# Patient Record
Sex: Female | Born: 1970 | Race: Black or African American | Hispanic: No | Marital: Single | Smoking: Never smoker
Health system: Southern US, Community
[De-identification: ages and names within clinical notes are randomized; demographics above are authoritative.]

---

## 1998-05-21 ENCOUNTER — Other Ambulatory Visit: Admission: RE | Admit: 1998-05-21 | Discharge: 1998-05-21 | Payer: Self-pay | Admitting: Obstetrics

## 1998-09-13 ENCOUNTER — Other Ambulatory Visit: Admission: RE | Admit: 1998-09-13 | Discharge: 1998-09-13 | Payer: Self-pay | Admitting: Obstetrics

## 1998-12-04 ENCOUNTER — Inpatient Hospital Stay (HOSPITAL_COMMUNITY): Admission: AD | Admit: 1998-12-04 | Discharge: 1998-12-06 | Payer: Self-pay | Admitting: Internal Medicine

## 1998-12-08 ENCOUNTER — Inpatient Hospital Stay (HOSPITAL_COMMUNITY): Admission: AD | Admit: 1998-12-08 | Discharge: 1998-12-08 | Payer: Self-pay | Admitting: Obstetrics

## 1999-01-14 ENCOUNTER — Other Ambulatory Visit: Admission: RE | Admit: 1999-01-14 | Discharge: 1999-01-14 | Payer: Self-pay | Admitting: Obstetrics

## 2001-01-06 ENCOUNTER — Other Ambulatory Visit: Admission: RE | Admit: 2001-01-06 | Discharge: 2001-01-06 | Payer: Self-pay | Admitting: Obstetrics and Gynecology

## 2001-04-09 ENCOUNTER — Other Ambulatory Visit: Admission: RE | Admit: 2001-04-09 | Discharge: 2001-04-09 | Payer: Self-pay | Admitting: Obstetrics and Gynecology

## 2001-06-25 ENCOUNTER — Other Ambulatory Visit: Admission: RE | Admit: 2001-06-25 | Discharge: 2001-06-25 | Payer: Self-pay | Admitting: Obstetrics and Gynecology

## 2001-07-29 ENCOUNTER — Inpatient Hospital Stay (HOSPITAL_COMMUNITY): Admission: AD | Admit: 2001-07-29 | Discharge: 2001-07-31 | Payer: Self-pay | Admitting: Obstetrics and Gynecology

## 2001-08-06 ENCOUNTER — Encounter: Admission: RE | Admit: 2001-08-06 | Discharge: 2001-09-05 | Payer: Self-pay | Admitting: Obstetrics and Gynecology

## 2001-09-06 ENCOUNTER — Encounter: Admission: RE | Admit: 2001-09-06 | Discharge: 2001-10-06 | Payer: Self-pay | Admitting: Obstetrics and Gynecology

## 2002-01-06 ENCOUNTER — Other Ambulatory Visit: Admission: RE | Admit: 2002-01-06 | Discharge: 2002-01-06 | Payer: Self-pay | Admitting: Obstetrics and Gynecology

## 2002-01-13 ENCOUNTER — Encounter: Admission: RE | Admit: 2002-01-13 | Discharge: 2002-01-13 | Payer: Self-pay | Admitting: Obstetrics and Gynecology

## 2002-01-13 ENCOUNTER — Encounter: Payer: Self-pay | Admitting: Obstetrics and Gynecology

## 2002-10-26 ENCOUNTER — Encounter: Payer: Self-pay | Admitting: Internal Medicine

## 2002-10-26 ENCOUNTER — Encounter: Admission: RE | Admit: 2002-10-26 | Discharge: 2002-10-26 | Payer: Self-pay | Admitting: Internal Medicine

## 2005-02-26 ENCOUNTER — Emergency Department (HOSPITAL_COMMUNITY): Admission: EM | Admit: 2005-02-26 | Discharge: 2005-02-26 | Payer: Self-pay | Admitting: Emergency Medicine

## 2013-08-03 ENCOUNTER — Emergency Department (HOSPITAL_COMMUNITY)
Admission: EM | Admit: 2013-08-03 | Discharge: 2013-08-04 | Disposition: A | Discharge status: Home / Self Care | Payer: BC Managed Care – PPO | Attending: Emergency Medicine | Admitting: Emergency Medicine

## 2013-08-03 ENCOUNTER — Encounter (HOSPITAL_COMMUNITY): Payer: Self-pay | Admitting: Emergency Medicine

## 2013-08-03 ENCOUNTER — Emergency Department (HOSPITAL_COMMUNITY): Payer: BC Managed Care – PPO

## 2013-08-03 DIAGNOSIS — IMO0002 Reserved for concepts with insufficient information to code with codable children: Secondary | ICD-10-CM | POA: Insufficient documentation

## 2013-08-03 DIAGNOSIS — S92919A Unspecified fracture of unspecified toe(s), initial encounter for closed fracture: Secondary | ICD-10-CM | POA: Insufficient documentation

## 2013-08-03 DIAGNOSIS — S92501A Displaced unspecified fracture of right lesser toe(s), initial encounter for closed fracture: Secondary | ICD-10-CM

## 2013-08-03 DIAGNOSIS — Y9389 Activity, other specified: Secondary | ICD-10-CM | POA: Insufficient documentation

## 2013-08-03 DIAGNOSIS — Y9289 Other specified places as the place of occurrence of the external cause: Secondary | ICD-10-CM | POA: Insufficient documentation

## 2013-08-03 NOTE — ED Notes (Signed)
Pt states was playing with her kids when she hit her R pinkie toe, R pinkie toe pain.

## 2013-08-04 MED ORDER — HYDROCODONE-ACETAMINOPHEN 5-325 MG PO TABS
1.0000 | ORAL_TABLET | Freq: Once | ORAL | Status: AC
  Administered 2013-08-04: 1 via ORAL
  Filled 2013-08-04: qty 1

## 2013-08-04 MED ORDER — HYDROCODONE-ACETAMINOPHEN 5-325 MG PO TABS: 1.0000 | ORAL_TABLET | Freq: Four times a day (QID) | ORAL | Status: DC | PRN

## 2013-08-04 NOTE — ED Provider Notes (Signed)
Medical screening examination/treatment/procedure(s) were performed by non-physician practitioner and as supervising physician I was immediately available for consultation/collaboration.   EKG Interpretation None        Lyanne CoKevin M Lorain Fettes, MD 08/04/13 [phone]

## 2013-08-04 NOTE — Discharge Instructions (Signed)
Please call your doctor for a followup appointment within 24-48 hours. When you talk to your doctor please let them know that you were seen in the emergency department and have them acquire all of your records so that they can discuss the findings with you and formulate a treatment plan to fully care for your new and ongoing problems. Please call and set up appointment with orthopedics regarding toe fracture Please keep toe buddy taped and placed in postop shoe Please avoid weightbearing Please rest, ice, elevate Please take medications as prescribed rash on pain medications his be no drink alcohol, driving, operating any heavy machinery if there is extra please disposer proper manner. Please do not take any extra Tylenol for this can lead to Tylenol overdose and liver failure Please continue to monitor symptoms closely and if symptoms are to worsen or change (fever greater than 101, chills, chest pain, shortness of breath, difficulty breathing, numbness, tingling, fall, injury) please report back to the ED immediately  Toe Fracture Your caregiver has diagnosed you as having a fractured toe. A toe fracture is a break in the bone of a toe. "Buddy taping" is a way of splinting your broken toe, by taping the broken toe to the toe next to it. This "buddy taping" will keep the injured toe from moving beyond normal range of motion. Buddy taping also helps the toe heal in a more normal alignment. It may take 6 to 8 weeks for the toe injury to heal. HOME CARE INSTRUCTIONS   Leave your toes taped together for as long as directed by your caregiver or until you see a doctor for a follow-up examination. You can change the tape after bathing. Always use a small piece of gauze or cotton between the toes when taping them together. This will help the skin stay dry and prevent infection.  Apply ice to the injury for 15-20 minutes each hour while awake for the first 2 days. Put the ice in a plastic bag and place a towel  between the bag of ice and your skin.  After the first 2 days, apply heat to the injured area. Use heat for the next 2 to 3 days. Place a heating pad on the foot or soak the foot in warm water as directed by your caregiver.  Keep your foot elevated as much as possible to lessen swelling.  Wear sturdy, supportive shoes. The shoes should not pinch the toes or fit tightly against the toes.  Your caregiver may prescribe a rigid shoe if your foot is very swollen.  Your may be given crutches if the pain is too great and it hurts too much to walk.  Only take over-the-counter or prescription medicines for pain, discomfort, or fever as directed by your caregiver.  If your caregiver has given you a follow-up appointment, it is very important to keep that appointment. Not keeping the appointment could result in a chronic or permanent injury, pain, and disability. If there is any problem keeping the appointment, you must call back to this facility for assistance. SEEK MEDICAL CARE IF:   You have increased pain or swelling, not relieved with medications.  The pain does not get better after 1 week.  Your injured toe is cold when the others are warm. SEEK IMMEDIATE MEDICAL CARE IF:   The toe becomes cold, numb, or white.  The toe becomes hot (inflamed) and red. Document Released: 03/07/2000 Document Revised: 06/02/2011 Document Reviewed: 10/25/2007 [facility] Patient Information 2014 [place], [place].  Emergency Department Resource Guide 1) Find a Doctor and Pay Out of Pocket Although you won't have to find out who is covered by your insurance plan, it is a good idea to ask around and get recommendations. You will then need to call the office and see if the doctor you have chosen will accept you as a new patient and what types of options they offer for patients who are self-pay. Some doctors offer discounts or will set up payment plans for their patients who do not have insurance, but you will need  to ask so you aren't surprised when you get to your appointment.  2) Contact Your Local Health Department Not all health departments have doctors that can see patients for sick visits, but many do, so it is worth a call to see if yours does. If you don't know where your local health department is, you can check in your phone book. The CDC also has a tool to help you locate your state's health department, and many state websites also have listings of all of their local health departments.  3) Find a Walk-in Clinic If your illness is not likely to be very severe or complicated, you may want to try a walk in clinic. These are popping up all over the country in pharmacies, drugstores, and shopping centers. They're usually staffed by nurse practitioners or physician assistants that have been trained to treat common illnesses and complaints. They're usually fairly quick and inexpensive. However, if you have serious medical issues or chronic medical problems, these are probably not your best option.  No Primary Care Doctor: - Call Health Connect at  [phone] - they can help you locate a primary care doctor that  accepts your insurance, provides certain services, etc. - Physician Referral Service- [phone]  Chronic Pain Problems: Organization         Address  Phone   Notes  [facility]  [phone] Patients need to be referred by their primary care doctor.   Medication Assistance: Organization         Address  Phone   Notes  [facility] Medication [facility] [address] [place], [place] [address] [phone] --Must be a resident of [facility] -- Must have NO insurance coverage whatsoever (no Medicaid/ Medicare, etc.) -- The pt. MUST have a primary care doctor that directs their care regularly and follows them in the community   MedAssist  [phone]   [employer]  [phone]    Agencies that provide inexpensive medical  care: Organization         Address  Phone   Notes  [facility]  [phone]   [facility]    [phone]   [facility] [address] [place], [place] [address] [phone]   [facility] [address] [place], [place] [phone]   Planned Parenthood    [phone]   [facility]    [phone]   Community Health and [facility]  [address], [place] Phone:  [phone], Fax:  [phone] Hours of Operation:  9 am - 6 pm, M-F.  Also accepts Medicaid/Medicare and self-pay.  [facility]  [address], [place] Phone: [phone], Fax: [phone]. Hours of Operation:  8:30 am - 5:30 pm, M-F.  Also accepts Medicaid and self-pay.  [facility] [place]  [address], [place] Phone: [phone]) [phone]   [facility] [address], [place], [place] [phone], Ext. [phone] Mondays & Thursdays: 7-9 AM.  First 15 patients are seen on a first come, first serve basis.    Medicaid-accepting [facility] Providers:  Organization         Address  Phone   Notes  [facility] [address], [place] [phone]) [phone] Also accepts self-pay patients.  [facility] [address], [place]  [phone]   [facility] [address], [address], [place] [phone]) [phone]   [facility] [address] [place] [address], [place] [phone]) [phone]   Renaye RakersVeita Bland [address], [place]   [phone] Only accepts [place] Access [place]icaid patients after they have their name applied to their card.   Self-Pay (no insurance) in [facility]:  Organization         Address  Phone   Notes  Sickle Cell Patients, [facility] [address], [place] [phone]   [facility] [address],  [place] [phone](336) [phone]   [facility]  [address], [place] [phone]) [phone]   [facility]/Dr. Osei-Bonsu  [address] [place] [address], [place] or [phone], [place] [phone] Phone number for both [place] and [place] locations is the same.  [facility] and [facility] [address], [place] [phone]) [phone]   [facility] [address] [place] [address], [place] or [address] [phone]   [facility] [address], [place] [phone]) [phone], phone; [phone](336) [phone], fax Sees patients 1st and 3rd Saturday of every month.  Must not qualify for public or private insurance (i.e. Medicaid, Medicare, [place] Health Choice, Veterans' Benefits)  Household income should be no more than 200% of the poverty level The clinic cannot treat you if you are pregnant or think you are pregnant  Sexually transmitted diseases are not treated at the clinic.    Dental Care: Organization         Address  Phone  Notes  [facility] Department of [facility] [address], [place] [phone]) [phone] Accepts children up to age 42 who are enrolled in [place]icaid or [place] Health Choice; pregnant women with a Medicaid card; and children who have applied for Medicaid or [place] Health Choice, but were declined, whose parents can pay a reduced fee at time of service.  [facility] of [facility] [place]  [address], [place] [phone] Accepts children up to age 42 who are enrolled in [place]icaid or [place] Health Choice; pregnant women with a Medicaid card; and children who have applied for Medicaid or [place] Health Choice, but were declined, whose parents can pay a reduced fee at time of service.  [facility]  [address], [place] [phone]) [phone] Patients are seen by appointment only. Walk-ins are not accepted. [facility] will see patients 42 years of age and older. Monday - Tuesday (8am-5pm) Most Wednesdays (8:30-5pm) $30 per visit, cash only  [facility]  [address], [facility] [place] [place] [phone]) [phone] Patients are seen by appointment only. Walk-ins are not accepted. [facility] will see patients 42 years of age and older.  One Wednesday Evening (Monthly: Volunteer Based).  $30 per visit, cash only  [employer] [facility] [facility]  [phone] for adults; Children under age 14, call Graduate Pediatric Dentistry at [phone]) [phone]. Children aged 634-14, please call [phone] to request a pediatric application.  Dental services are provided in all areas of dental care including fillings, crowns and bridges, complete and partial dentures, implants, gum treatment, root canals, and extractions. Preventive care is also provided. Treatment is provided to both adults and children. Patients are selected via a lottery and there is often a waiting list.   [facility] [address], [place]  [phone]) [phone] [email or web address]   [facility] [address], [place], [place] [phone], Ext. [phone] Second and Fourth Thursday of each month, opens at 6:30 AM; Clinic ends at 9 AM.  Patients are seen on a first-come first-served basis, and a limited number are seen during each clinic.   [facility]  [address], [place], [place] [phone]   Eligibility Requirements You must have lived in [place], [place], or [place] counties for at least the last three months.   You cannot be eligible for state or federal sponsored National Cityhealthcare insurance, including CIGNAVeterans Administration, [place]icaid, or [employer] EntertainmentMedicare.   You generally cannot be eligible for healthcare insurance through your employer.    How to apply: Eligibility screenings are held every Tuesday and Wednesday afternoon from 1:00 pm until 4:00 pm. You do not need an appointment for the interview!   [facility] [address], [place], [place] [phone]   [facility]  [phone]   [facility]  [phone]   [facility]  [phone]    Behavioral Health Resources in the Community: Intensive Outpatient Programs Organization         Address  Phone  Notes  [facility] [address], [place], [place] [phone]   [facility]patient [address], [place], [place] [phone]   [facility] [address], [place], [place]  [phone]   [facility] [address],  [place], [place] [phone] or [phone]   Substance Abuse Resources Organization         Address  Phone  Notes  [facility]  [phone]   [facility]  [phone]   [facility]  [phone]   [facility]  [phone]   Residential & Outpatient Substance Abuse Program  [phone]   Psychological Services Organization         Address  Phone  Notes  [facility]  [phone]- [phone]   [facility]  [phone]- [phone]   [facility] [address], [place] [phone] or [phone]    Mobile Crisis Teams Organization         Address  Phone  Notes  [facility], Mobile Crisis Care Unit  [phone]   [facility]  [address] [place], [place] [phone]   Doristine LocksSharon DeEsch [address], [address] [place] [phone]    Self-Help/Support Groups Organization         Address  Phone             Notes  [facility] [place] - variety of support groups  336- [phone] Call for more information  Narcotics Anonymous (NA), [facility] [address], [place]  2 meetings at this location  Residential Treatment Programs Organization         Address  Phone  Notes  [facility] [address],    [place]  [phone]   [facility]  [address], [place] [phone], [place], [place] [phone]   [facility] [address], [place] [phone] Admissions: 8am-3pm M-F  [facility] [address]    [place], [place] [phone]   [facility] [address], [place], [place] [phone]   [facility] [address]  [place], [place] [phone]   [facility] - Intensive Outpatient [address], [place], [place] [phone]   [facility] (Addiction Recovery Care [facility] [address]  [place], [place] [phone] or [phone]   [facility] (RTS) [address] [place], [place] [phone] Accepts Medicaid  [facility] [address]  [place] [phone] Substance Abuse/Addiction Treatment   [facility] Resources Organization         Address  Phone  Notes  [facility]  [phone]   Angie FavaJulie Brannon, PhD [address], [address] [place], [place]   [phone]) [phone] or [phone]   [facility]   [address] [place], [place] [phone](336) [phone]   [facility] [address], [place], [place] [phone](336) [phone] Insurance/Medicaid/sponsorship through [facility]  [facility] [address]                                    [place], [place] [phone] Therapy/tele-psych/case  [facility] [address].   [place], [place] [phone]) [phone]    Dr. Lolly MustacheArfeen  [phone]) [phone]   [facility]  [employer] [facility] 1) [address], [place] 2) [address], [place] 3)  [address], [place] [phone](336) [phone]  [phone](336) [phone]   [facility] Child Abuse Hotline [phone]) [phone] or [phone](336) [phone] (After Hours)

## 2013-08-04 NOTE — ED Provider Notes (Signed)
CSN: [id number]     Arrival date & time 08/03/13  2121 History   First MD Initiated Contact with Patient 08/04/13 0009     Chief Complaint  Patient presents with  . Toe Injury     (Consider location/radiation/quality/duration/timing/severity/associated sxs/prior Treatment) The history is provided by the patient. No language interpreter was used.  Tara Harrison is a 43 year old female with no significant past medical history presenting to the ED with right pinky toe pain after hitting her pinky toe on her son's leg. Stated that the pain was instantaneous worse with bending and applying pressure. Reported that she has a mild numbness to the right pinky toe. Denied radiation of pain. Reported that she took 600 mg ibuprofen prior to arrival to the ED. Denied previous injury, loss of sensation. PCP none  History reviewed. No pertinent past medical history. History reviewed. No pertinent past surgical history. No family history on file. History  Substance Use Topics  . Smoking status: Never Smoker   . Smokeless tobacco: Never Used  . Alcohol Use: No   OB History   Grav Para Term Preterm Abortions TAB SAB Ect Mult Living                 Review of Systems  Musculoskeletal: Positive for arthralgias (right pinky toe). Negative for gait problem.  Neurological: Positive for numbness. Negative for weakness.  All other systems reviewed and are negative.     Allergies  Review of patient's allergies indicates not on file.  Home Medications   Prior to Admission medications   Not on File   BP 103/64  Pulse 64  Temp(Src) 98.8 F (37.1 C) (Oral)  Resp 16  SpO2 100%  LMP 07/20/2013 Physical Exam  Nursing note and vitals reviewed. Constitutional: She is oriented to person, place, and time. She appears well-developed and well-nourished. No distress.  HENT:  Head: Normocephalic and atraumatic.  Eyes: Conjunctivae and EOM are normal. Pupils are equal, round, and reactive to light.  Right eye exhibits no discharge. Left eye exhibits no discharge.  Neck: Normal range of motion. Neck supple. No tracheal deviation present.  Cardiovascular: Normal rate, regular rhythm and normal heart sounds.  Exam reveals no friction rub.   No murmur heard. Pulmonary/Chest: Effort normal and breath sounds normal. No respiratory distress. She has no wheezes. She has no rales.  Musculoskeletal: Normal range of motion. She exhibits tenderness.  Mild outward deviation identified to the pinky toe of the right foot. Negative swelling, erythema, inflammation, ecchymosis identified. Discomfort upon palpation circumferentially. Decreased range of motion to the pinky toe secondary to pain. Full range of motion to remaining digits of the right foot. Full ROM to upper and lower extremities without difficulty noted, negative ataxia noted.  Lymphadenopathy:    She has no cervical adenopathy.  Neurological: She is alert and oriented to person, place, and time. No cranial nerve deficit. She exhibits normal muscle tone. Coordination normal.  Cranial nerves III-XII grossly intact Strength 5+/5+ to lower extremities bilaterally with resistance applied, equal distribution noted Strength intact to the digits of the right foot Sensation intact with differentiation to sharp and dull touch  Gait proper, proper balance - negative sway, negative drift, negative step-offs  Skin: Skin is warm and dry. No rash noted. She is not diaphoretic. No erythema.  Psychiatric: She has a normal mood and affect. Her behavior is normal. Thought content normal.    ED Course  Procedures (including critical care time)  12:39 AM This provider  spoke with Dr. Magnus IvanBlackman, orthopedics - discussed case, history, imaging. Discussed with provider that this provider tried to align the toe with unsuccessful results.  Labs Review Labs Reviewed - No data to display  Imaging Review Dg Toe 5th Right  08/03/2013   CLINICAL DATA:  Right fifth toe  injury.  EXAM: RIGHT FIFTH TOE  COMPARISON:  None.  FINDINGS: Mostly transverse fracture of the proximal aspect of the proximal phalanx of the right fifth toe. There is dorsal and lateral angulation of the distal fracture fragment. With partial volar displacement.  IMPRESSION: Fracture of the proximal phalanx right fifth toe.   Electronically Signed   By: Burman NievesWilliam  Stevens M.D.   On: 08/03/2013 23:26     EKG Interpretation None      MDM   Final diagnoses:  Fracture of fifth toe, right, closed   Medications  HYDROcodone-acetaminophen (NORCO/VICODIN) 5-325 MG per tablet 1 tablet (1 tablet Oral Given 08/04/13 0056)    Filed Vitals:   08/03/13 2208 08/04/13 0057  BP: 100/78 103/64  Pulse: 63 64  Temp: 99.1 F (37.3 C) 98.8 F (37.1 C)  TempSrc: Oral Oral  Resp: 18 16  SpO2: 100% 100%   Plain film of fifth digit of the right foot identified transverse fracture the proximal aspect of the proximal phalanx of the right fifth toe with dorsolateral angulation with partial volar displacement. This provider tried to properly align the toe with unsuccessful results. This provider spoke with Dr. Magnus IvanBlackman, orthopedic surgeon-discussed case. Patient placed in buddy tape postop shoe and crutches. Recommended patient be followed up as an outpatient. Patient neurovascular intact. Negative open fracture. Pulses palpable and strong DP and PT. Strength intact with sensation intact. Patient stable, afebrile. Discharged patient. Discharged patient with postop shoe and crutches. Discharge patient with pain medications-discussed course, cautions, disposal technique. Discussed with patient rest, ice, elevate. Discussed with patient to closely monitor symptoms and if symptoms are to worsen or change to report back to the ED - strict return instructions given.  Patient agreed to plan of care, understood, all questions answered.     Raymon MuttonMarissa Jazell Rosenau, PA-C 08/04/13 [id number]

## 2015-01-15 ENCOUNTER — Other Ambulatory Visit: Payer: Self-pay | Admitting: Family Medicine

## 2015-01-15 ENCOUNTER — Encounter: Payer: Self-pay | Admitting: Family Medicine

## 2015-01-15 ENCOUNTER — Ambulatory Visit (INDEPENDENT_AMBULATORY_CARE_PROVIDER_SITE_OTHER): Payer: BLUE CROSS/BLUE SHIELD | Admitting: Family Medicine

## 2015-01-15 VITALS — BP 110/72 | HR 66 | Ht 66.0 in | Wt 168.6 lb

## 2015-01-15 DIAGNOSIS — R5383 Other fatigue: Secondary | ICD-10-CM | POA: Diagnosis not present

## 2015-01-15 DIAGNOSIS — R42 Dizziness and giddiness: Secondary | ICD-10-CM

## 2015-01-15 DIAGNOSIS — Z7689 Persons encountering health services in other specified circumstances: Secondary | ICD-10-CM

## 2015-01-15 DIAGNOSIS — Z7189 Other specified counseling: Secondary | ICD-10-CM | POA: Diagnosis not present

## 2015-01-15 LAB — COMPREHENSIVE METABOLIC PANEL WITH GFR
ALT: 11 U/L (ref 6–29)
AST: 16 U/L (ref 10–30)
Albumin: 3.8 g/dL (ref 3.6–5.1)
Alkaline Phosphatase: 54 U/L (ref 33–115)
BUN: 7 mg/dL (ref 7–25)
CO2: 26 mmol/L (ref 20–31)
Calcium: 8.8 mg/dL (ref 8.6–10.2)
Chloride: 105 mmol/L (ref 98–110)
Creat: 0.91 mg/dL (ref 0.50–1.10)
Glucose, Bld: 84 mg/dL (ref 65–99)
Potassium: 3.4 mmol/L — ABNORMAL LOW (ref 3.5–5.3)
Sodium: 138 mmol/L (ref 135–146)
Total Bilirubin: 0.4 mg/dL (ref 0.2–1.2)
Total Protein: 6.6 g/dL (ref 6.1–8.1)

## 2015-01-15 LAB — CBC WITH DIFFERENTIAL/PLATELET
BASOS PCT: 1 % (ref 0–1)
Basophils Absolute: 0 10*3/uL (ref 0.0–0.1)
EOS ABS: 0 10*3/uL (ref 0.0–0.7)
EOS PCT: 0 % (ref 0–5)
HCT: 34.1 % — ABNORMAL LOW (ref 36.0–46.0)
Hemoglobin: 11.2 g/dL — ABNORMAL LOW (ref 12.0–15.0)
Lymphocytes Relative: 56 % — ABNORMAL HIGH (ref 12–46)
Lymphs Abs: 1.8 10*3/uL (ref 0.7–4.0)
MCH: 28.8 pg (ref 26.0–34.0)
MCHC: 32.8 g/dL (ref 30.0–36.0)
MCV: 87.7 fL (ref 78.0–100.0)
MPV: 11.3 fL (ref 8.6–12.4)
Monocytes Absolute: 0.4 10*3/uL (ref 0.1–1.0)
Monocytes Relative: 12 % (ref 3–12)
Neutro Abs: 1 10*3/uL — ABNORMAL LOW (ref 1.7–7.7)
Neutrophils Relative %: 31 % — ABNORMAL LOW (ref 43–77)
PLATELETS: 229 10*3/uL (ref 150–400)
RBC: 3.89 MIL/uL (ref 3.87–5.11)
RDW: 14.7 % (ref 11.5–15.5)
WBC: 3.3 10*3/uL — ABNORMAL LOW (ref 4.0–10.5)

## 2015-01-15 NOTE — Progress Notes (Signed)
Subjective:    Patient ID: Tara Harrison, female    DOB: 10-Nov-1970, 44 y.o.   MRN: [id number]  HPI She is new to the practice and here to establish primary care. She is here for a 2 month history of feeling more tired than usual and reports feeling lightheaded, she notices this sometimes in morning and occasionally after lunch. States she has not noticed a pattern to this and her lightheadedness does not seem to be associated with changes in position or with eating.  States she is staying hydrated but has been eating fewer calories for past few weeks. Does not eat regular meals and has been cutting back by about 50% and trying to lose weight.  States she is sleeping ok and getting 6-7 hours per night.  Is not currently exercising.  Has not had a primary care provider in past and cannot recall her last physical exam. She states she does get labs checked annually through work.   Denies fever, chills, unexplained weight loss, chest pain, palpitations, shortness of breath, GI or GU issues, arthralgias or myalgias. Denies numbness, tingling, weakness. Denies any changes to stress level. Taking Biotin for hair falling out last year when she was going through a divorce and she has continued to take this.  Taking Iron otc and states she takes this because she was craving ice, denies history of anemia.   Has 3 kids, divorced, works at [employer] in [place].   Has not seen a gynecologist and is unsure of last pap smear. Mammogram about 5 years ago and was told it was normal. No colonoscopy.   LMP: regular cycles, 1 heavy day and not bad bleeding. Occasional night sweats.   Immunizations not on file. No medical records to review.   Reviewed allergies, medications, past medical, surgical, family and social history.     Review of Systems  Pertinent positives and negatives in the history of present illness.    Objective:   Physical Exam  Constitutional: She is oriented to person,  place, and time. She appears well-developed and well-nourished. No distress.  HENT:  Head: Normocephalic and atraumatic.  Mouth/Throat: Uvula is midline, oropharynx is clear and moist and mucous membranes are normal. No oral lesions. No oropharyngeal exudate.  Neck: Normal range of motion. Neck supple. No thyromegaly present.  Cardiovascular: Normal rate, regular rhythm, normal heart sounds and normal pulses.  Exam reveals no gallop and no friction rub.   No murmur heard. Pulmonary/Chest: Effort normal and breath sounds normal.  Lymphadenopathy:       Head (right side): No submandibular, no tonsillar and no occipital adenopathy present.       Head (left side): No submandibular, no tonsillar and no occipital adenopathy present.    She has no cervical adenopathy.       Right: No supraclavicular adenopathy present.       Left: No supraclavicular adenopathy present.  Neurological: She is alert and oriented to person, place, and time. She has normal strength. No cranial nerve deficit or sensory deficit. Gait normal.  Skin: Skin is warm and dry. No cyanosis. No pallor. Nails show no clubbing.  Psychiatric: She has a normal mood and affect. Her speech is normal and behavior is normal. Thought content normal.   BP 110/72 mmHg  Pulse 66  Ht  (1.676 m)  Wt 168 lb 9.6 oz (76.476 kg)  BMI 27.23 kg/m2  LMP 01/01/2015  Orthostatic vitals -  Not postural  EKG- sinus bradycardia, otherwise  normal 55 bpm PR interval 136 ms QRS duration 82 ms QT/QTc 394/376 ms    Assessment & Plan:  Other fatigue - Plan: EKG 12-Lead, CBC with Differential/Platelet, Comprehensive metabolic panel, TSH, Vit D  25 hydroxy (rtn osteoporosis monitoring)  Lightheadedness - Plan: EKG 12-Lead, CBC with Differential/Platelet, Comprehensive metabolic panel, Orthostatic vital signs, Vit D  25 hydroxy (rtn osteoporosis monitoring)  Encounter to establish care  Discussed the various etiologies for fatigue. No previous  EKG or blood pressure readings to compare today's results. Do not suspect that this is cardiac related. Suspicious for anemia due to history of pica. Recommend that she keep a diary of when she feels lightheaded and any other associated symptoms. Also discussed that she should eat regular meals and avoid skipping meals. Recommend she stay well-hydrated. She will try to get previous lab results from her employer sent to us. Will follow up pending labs.  Spent a minimum of 45 minutes with patient including 50% of the time in counseling and coordination of care.

## 2015-01-15 NOTE — Patient Instructions (Signed)
Fatigue Fatigue is feeling tired all of the time, a lack of energy, or a lack of motivation. Occasional or mild fatigue is often a normal response to activity or life in general. However, long-lasting (chronic) or extreme fatigue may indicate an underlying medical condition. HOME CARE INSTRUCTIONS  Watch your fatigue for any changes. The following actions may help to lessen any discomfort you are feeling:  Talk to your health care provider about how much sleep you need each night. Try to get the required amount every night.  Take medicines only as directed by your health care provider. Eat a healthy and nutritious diet. Ask your health care provider if you need he  Dizziness Dizziness is a common problem. It is a feeling of unsteadiness or light-headedness. You may feel like you are about to faint. Dizziness can lead to injury if you stumble or fall. Anyone can become dizzy, but dizziness is more common in older adults. This condition can be caused by a number of things, including medicines, dehydration, or illness. HOME CARE INSTRUCTIONS Taking these steps may help with your condition: Eating and Drinking Drink enough fluid to keep your urine clear or pale yellow. This helps to keep you from becoming dehydrated. Try to drink more clear fluids, such as water. Do not drink alcohol. Limit your caffeine intake if directed by your health care provider. Limit your salt intake if directed by your health care provider. Activity Avoid making quick movements. Rise slowly from chairs and steady yourself until you feel okay. In the morning, first sit up on the side of the bed. When you feel okay, stand slowly while you hold onto something until you know that your balance is fine. Move your legs often if you need to stand in one place for a long time. Tighten and relax your muscles in your legs while you are standing. Do not drive or operate heavy machinery if you feel dizzy. Avoid bending down if you  feel dizzy. Place items in your home so that they are easy for you to reach without leaning over. Lifestyle Do not use any tobacco products, including cigarettes, chewing tobacco, or electronic cigarettes. If you need help quitting, ask your health care provider. Try to reduce your stress level, such as with yoga or meditation. Talk with your health care provider if you need help. General Instructions Watch your dizziness for any changes. Take medicines only as directed by your health care provider. Talk with your health care provider if you think that your dizziness is caused by a medicine that you are taking. Tell a friend or a family member that you are feeling dizzy. If he or she notices any changes in your behavior, have this person call your health care provider. Keep all follow-up visits as directed by your health care provider. This is important. SEEK MEDICAL CARE IF: Your dizziness does not go away. Your dizziness or light-headedness gets worse. You feel nauseous. You have reduced hearing. You have new symptoms. You are unsteady on your feet or you feel like the room is spinning. SEEK IMMEDIATE MEDICAL CARE IF: You vomit or have diarrhea and are unable to eat or drink anything. You have problems talking, walking, swallowing, or using your arms, hands, or legs. You feel generally weak. You are not thinking clearly or you have trouble forming sentences. It may take a friend or family member to notice this. You have chest pain, abdominal pain, shortness of breath, or sweating. Your vision changes. You notice any  bleeding. You have a headache. You have neck pain or a stiff neck. You have a fever.   This information is not intended to replace advice given to you by your health care provider. Make sure you discuss any questions you have with your health care provider.   Document Released: 09/03/2000 Document Revised: 07/25/2014 Document Reviewed: 03/06/2014 Elsevier Interactive  Patient Education 2016 [employer].  lp changing your diet.  Drink enough fluid to keep your urine clear or pale yellow.  Practice ways of relaxing, such as yoga, meditation, massage therapy, or acupuncture.  Exercise regularly.   Change situations that cause you stress. Try to keep your work and personal routine reasonable.  Do not abuse illegal drugs.  Limit alcohol intake to no more than 1 drink per day for nonpregnant women and 2 drinks per day for men. One drink equals 12 ounces of beer, 5 ounces of wine, or 1 ounces of hard liquor.  Take a multivitamin, if directed by your health care provider. SEEK MEDICAL CARE IF:   Your fatigue does not get better.  You have a fever.   You have unintentional weight loss or gain.  You have headaches.   You have difficulty:   Falling asleep.  Sleeping throughout the night.  You feel angry, guilty, anxious, or sad.   You are unable to have a bowel movement (constipation).   You skin is dry.   Your legs or another part of your body is swollen.  SEEK IMMEDIATE MEDICAL CARE IF:   You feel confused.   Your vision is blurry.  You feel faint or pass out.   You have a severe headache.   You have severe abdominal, pelvic, or back pain.   You have chest pain, shortness of breath, or an irregular or fast heartbeat.   You are unable to urinate or you urinate less than normal.   You develop abnormal bleeding, such as bleeding from the rectum, vagina, nose, lungs, or nipples.  You vomit blood.   You have thoughts about harming yourself or committing suicide.   You are worried that you might harm someone else.    This information is not intended to replace advice given to you by your health care provider. Make sure you discuss any questions you have with your health care provider.   Document Released: 01/05/2007 Document Revised: 03/31/2014 Document Reviewed: 07/12/2013 Elsevier Interactive Patient  Education [employer]

## 2015-01-16 LAB — TSH: TSH: 0.964 u[IU]/mL (ref 0.350–4.500)

## 2015-01-16 LAB — VITAMIN D 25 HYDROXY (VIT D DEFICIENCY, FRACTURES): VIT D 25 HYDROXY: 15 ng/mL — AB (ref 30–100)

## 2015-01-17 ENCOUNTER — Other Ambulatory Visit: Payer: Self-pay | Admitting: Family Medicine

## 2015-01-17 DIAGNOSIS — E559 Vitamin D deficiency, unspecified: Secondary | ICD-10-CM

## 2015-01-17 DIAGNOSIS — D649 Anemia, unspecified: Secondary | ICD-10-CM

## 2015-01-17 LAB — IRON AND TIBC
%SAT: 15 % (ref 11–50)
Iron: 37 ug/dL — ABNORMAL LOW (ref 40–190)
TIBC: 255 ug/dL (ref 250–450)
UIBC: 218 ug/dL (ref 125–400)

## 2015-01-17 MED ORDER — VITAMIN D (ERGOCALCIFEROL) 1.25 MG (50000 UNIT) PO CAPS: 50000.0000 [IU] | ORAL_CAPSULE | ORAL | Status: DC

## 2015-01-18 ENCOUNTER — Telehealth: Payer: Self-pay | Admitting: Family Medicine

## 2015-01-18 LAB — FERRITIN: FERRITIN: 47 ng/mL (ref 10–291)

## 2015-01-18 NOTE — Telephone Encounter (Signed)
Pt dropped off  BP and lab results (put in your folder)

## 2015-01-24 ENCOUNTER — Encounter: Payer: Self-pay | Admitting: Family Medicine

## 2015-02-10 ENCOUNTER — Other Ambulatory Visit: Payer: Self-pay | Admitting: Family Medicine

## 2015-03-06 ENCOUNTER — Other Ambulatory Visit: Payer: Self-pay | Admitting: Family Medicine

## 2015-03-06 NOTE — Telephone Encounter (Signed)
Please call the patient, she needs to come in for a repeat vitamin D level to see if the medication has helped. We will then be able to determine if she needs to continue on this high dose vitamin D or start taking an over-the-counter vitamin D.

## 2015-03-06 NOTE — Telephone Encounter (Signed)
done

## 2015-03-06 NOTE — Telephone Encounter (Signed)
Is this ok to refill?  

## 2015-03-07 ENCOUNTER — Other Ambulatory Visit: Payer: Self-pay | Admitting: Family Medicine

## 2015-03-07 ENCOUNTER — Other Ambulatory Visit: Payer: BLUE CROSS/BLUE SHIELD

## 2015-03-07 DIAGNOSIS — E559 Vitamin D deficiency, unspecified: Secondary | ICD-10-CM

## 2015-03-08 LAB — VITAMIN D 25 HYDROXY (VIT D DEFICIENCY, FRACTURES): Vit D, 25-Hydroxy: 43 ng/mL (ref 30–100)

## 2015-04-10 ENCOUNTER — Other Ambulatory Visit: Payer: 59 | Admitting: Internal Medicine

## 2015-04-10 DIAGNOSIS — Z0000 Encounter for general adult medical examination without abnormal findings: Secondary | ICD-10-CM

## 2015-04-10 DIAGNOSIS — E559 Vitamin D deficiency, unspecified: Secondary | ICD-10-CM

## 2015-04-10 DIAGNOSIS — Z1329 Encounter for screening for other suspected endocrine disorder: Secondary | ICD-10-CM

## 2015-04-10 DIAGNOSIS — Z130 Encounter for screening for diseases of the blood and blood-forming organs and certain disorders involving the immune mechanism: Secondary | ICD-10-CM

## 2015-04-10 DIAGNOSIS — Z13220 Encounter for screening for lipoid disorders: Secondary | ICD-10-CM

## 2015-04-10 LAB — COMPLETE METABOLIC PANEL WITH GFR
ALBUMIN: 3.9 g/dL (ref 3.6–5.1)
ALK PHOS: 50 U/L (ref 33–115)
ALT: 10 U/L (ref 6–29)
AST: 15 U/L (ref 10–30)
BUN: 15 mg/dL (ref 7–25)
CO2: 20 mmol/L (ref 20–31)
Calcium: 8.8 mg/dL (ref 8.6–10.2)
Chloride: 108 mmol/L (ref 98–110)
Creat: 0.9 mg/dL (ref 0.50–1.10)
GFR, Est African American: >89 mL/min (ref >=60)
GFR, Est Non African American: 79 mL/min (ref >=60)
GLUCOSE: 100 mg/dL — AB (ref 65–99)
POTASSIUM: 3.9 mmol/L (ref 3.5–5.3)
Sodium: 138 mmol/L (ref 135–146)
Total Bilirubin: 0.4 mg/dL (ref 0.2–1.2)
Total Protein: 6.9 g/dL (ref 6.1–8.1)

## 2015-04-10 LAB — CBC WITH DIFFERENTIAL/PLATELET
Basophils Absolute: 0 10*3/uL (ref 0.0–0.1)
Basophils Relative: 0 % (ref 0–1)
EOS PCT: 1 % (ref 0–5)
Eosinophils Absolute: 0 10*3/uL (ref 0.0–0.7)
HCT: 38 % (ref 36.0–46.0)
Hemoglobin: 12.3 g/dL (ref 12.0–15.0)
LYMPHS ABS: 1.3 10*3/uL (ref 0.7–4.0)
Lymphocytes Relative: 39 % (ref 12–46)
MCH: 29.2 pg (ref 26.0–34.0)
MCHC: 32.4 g/dL (ref 30.0–36.0)
MCV: 90.3 fL (ref 78.0–100.0)
MONO ABS: 0.4 10*3/uL (ref 0.1–1.0)
MPV: 11.2 fL (ref 8.6–12.4)
Monocytes Relative: 11 % (ref 3–12)
Neutro Abs: 1.7 10*3/uL (ref 1.7–7.7)
Neutrophils Relative %: 49 % (ref 43–77)
PLATELETS: 220 10*3/uL (ref 150–400)
RBC: 4.21 MIL/uL (ref 3.87–5.11)
RDW: 13 % (ref 11.5–15.5)
WBC: 3.4 10*3/uL — ABNORMAL LOW (ref 4.0–10.5)

## 2015-04-10 LAB — TSH: TSH: 2.755 u[IU]/mL (ref 0.350–4.500)

## 2015-04-10 NOTE — Addendum Note (Signed)
Addended by: Dierdre Forth on: 04/10/2015 09:17 AM   Modules accepted: Orders

## 2015-04-11 LAB — VITAMIN D 25 HYDROXY (VIT D DEFICIENCY, FRACTURES): Vit D, 25-Hydroxy: 30 ng/mL (ref 30–100)

## 2015-04-13 ENCOUNTER — Other Ambulatory Visit (HOSPITAL_COMMUNITY)
Admission: RE | Admit: 2015-04-13 | Discharge: 2015-04-13 | Disposition: A | Discharge status: Home / Self Care | Payer: 59 | Source: Ambulatory Visit | Attending: Internal Medicine | Admitting: Internal Medicine

## 2015-04-13 ENCOUNTER — Ambulatory Visit (INDEPENDENT_AMBULATORY_CARE_PROVIDER_SITE_OTHER): Payer: 59 | Admitting: Internal Medicine

## 2015-04-13 ENCOUNTER — Encounter: Payer: Self-pay | Admitting: Internal Medicine

## 2015-04-13 VITALS — BP 110/70 | HR 78 | Temp 98.9°F | Resp 20 | Ht 66.0 in | Wt 172.0 lb

## 2015-04-13 DIAGNOSIS — L659 Nonscarring hair loss, unspecified: Secondary | ICD-10-CM

## 2015-04-13 DIAGNOSIS — Z124 Encounter for screening for malignant neoplasm of cervix: Secondary | ICD-10-CM | POA: Diagnosis not present

## 2015-04-13 DIAGNOSIS — Z01419 Encounter for gynecological examination (general) (routine) without abnormal findings: Secondary | ICD-10-CM | POA: Diagnosis not present

## 2015-04-13 DIAGNOSIS — D509 Iron deficiency anemia, unspecified: Secondary | ICD-10-CM | POA: Diagnosis not present

## 2015-04-13 DIAGNOSIS — E559 Vitamin D deficiency, unspecified: Secondary | ICD-10-CM

## 2015-04-13 DIAGNOSIS — E611 Iron deficiency: Secondary | ICD-10-CM

## 2015-04-13 DIAGNOSIS — Z0000 Encounter for general adult medical examination without abnormal findings: Secondary | ICD-10-CM

## 2015-04-13 LAB — POCT URINALYSIS DIPSTICK
Bilirubin, UA: NEGATIVE
Blood, UA: NEGATIVE
Glucose, UA: NEGATIVE
Ketones, UA: NEGATIVE
LEUKOCYTES UA: NEGATIVE
Nitrite, UA: NEGATIVE
PROTEIN UA: NEGATIVE
Spec Grav, UA: 1.01
Urobilinogen, UA: 0.2
pH, UA: 7

## 2015-04-13 NOTE — Patient Instructions (Addendum)
It was a pleasure to see you  Today. Take 50,000 units vitamin D weekly for 12 weeks and then take 2000 units vitamin D 3 daily. Have mammogram. Take iron supplement over-the-counter daily.

## 2015-04-14 ENCOUNTER — Encounter: Payer: Self-pay | Admitting: Internal Medicine

## 2015-04-14 NOTE — Progress Notes (Signed)
   Subjective:    Patient ID: Tara Harrison, female    DOB: Aug 03, 1970, 45 y.o.   MRN: [id number]  HPI Pleasant 45 year old Black Female in today for health maintenance exam and to establish care here. Previously seen by Beather Arbour at [facility].  Has had some issues with vertigo and insomnia. No history of serious illnesses accidents or operations. No known drug allergies. She takes iron, biotin, and vitamin D.   She does not smoke or consume alcohol.  Social history: She is divorced and has 3 children 2 daughters ages 92 and 44 and a son age 63. 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  Family history: Father with history of congestive heart failure 54 years old and mother age 5 with hypertension. 3 brothers one of whom has hypertension. No sisters.  Been seen by Beather Arbour 01/15/2015 she was having some issues with lightheadedness but was not eating a lot having tried to lose weight. Was fatigued. She had an EKG done at that time which was normal except for rate of 55. Apparently hair started falling out while she was going through a divorce. Was taking over-the-counter iron because she was craving ice. Denied history of anemia. Not complaining of these issues today. Had vitamin D level of 15 in October. Vitamin D level is now 30. White blood cell count is slightly low at 3400 and was 3300 in October. This may be baseline for her.      Review of Systems see above     Objective:   Physical Exam  Constitutional: She is oriented to person, place, and time. She appears well-developed and well-nourished.  HENT:  Head: Normocephalic and atraumatic.  Right Ear: External ear normal.  Left Ear: External ear normal.  Mouth/Throat: Oropharynx is clear and moist.  Eyes: Conjunctivae and EOM are normal. Pupils are equal, round, and reactive to light. Right eye exhibits no discharge. Left eye exhibits no  discharge. No scleral icterus.  Neck: Neck supple. No JVD present. No thyromegaly present.  Cardiovascular: Normal rate, regular rhythm and normal heart sounds.   No murmur heard. Pulmonary/Chest: Effort normal and breath sounds normal. No respiratory distress. She has no wheezes. She has no rales.  Abdominal: Soft. Bowel sounds are normal. She exhibits no distension and no mass. There is no tenderness. There is no rebound and no guarding.  Genitourinary:  Pap taken. Bimanual normal.  Musculoskeletal: She exhibits no edema.  Lymphadenopathy:    She has no cervical adenopathy.  Neurological: She is alert and oriented to person, place, and time. She has normal reflexes. She displays normal reflexes. No cranial nerve deficit. Coordination normal.  Skin: Skin is warm and dry. No rash noted.  Psychiatric: She has a normal mood and affect. Her behavior is normal. Judgment and thought content normal.  Vitals reviewed.         Assessment & Plan:  Normal health maintenance exam  History of hair loss-likely due to stress. TSH is normal.  History of vitamin D deficiency-now low normal. Needs to take Drisdol 50,000 units weekly for 12 weeks and then Vitamin D3 2000 units daily  Mild neutropenia-continue to follow. Could be baseline for her.  History of pica and iron level was 37 in October 2016. Continue iron supplement over-the-counter

## 2015-04-17 LAB — CYTOLOGY - PAP

## 2015-04-20 ENCOUNTER — Other Ambulatory Visit: Payer: Self-pay | Admitting: Internal Medicine

## 2015-04-20 DIAGNOSIS — Z1231 Encounter for screening mammogram for malignant neoplasm of breast: Secondary | ICD-10-CM

## 2015-05-02 ENCOUNTER — Ambulatory Visit
Admission: RE | Admit: 2015-05-02 | Discharge: 2015-05-02 | Disposition: A | Discharge status: Home / Self Care | Payer: 59 | Source: Ambulatory Visit | Attending: Internal Medicine | Admitting: Internal Medicine

## 2015-05-02 DIAGNOSIS — Z1231 Encounter for screening mammogram for malignant neoplasm of breast: Secondary | ICD-10-CM

## 2015-05-24 ENCOUNTER — Ambulatory Visit (INDEPENDENT_AMBULATORY_CARE_PROVIDER_SITE_OTHER): Payer: 59 | Admitting: Internal Medicine

## 2015-05-24 ENCOUNTER — Encounter: Payer: Self-pay | Admitting: Internal Medicine

## 2015-05-24 VITALS — Temp 97.5°F | Resp 18 | Wt 165.0 lb

## 2015-05-24 DIAGNOSIS — R42 Dizziness and giddiness: Secondary | ICD-10-CM

## 2015-05-24 DIAGNOSIS — F439 Reaction to severe stress, unspecified: Secondary | ICD-10-CM

## 2015-05-24 DIAGNOSIS — D509 Iron deficiency anemia, unspecified: Secondary | ICD-10-CM | POA: Diagnosis not present

## 2015-05-24 DIAGNOSIS — Z658 Other specified problems related to psychosocial circumstances: Secondary | ICD-10-CM | POA: Diagnosis not present

## 2015-05-24 DIAGNOSIS — E559 Vitamin D deficiency, unspecified: Secondary | ICD-10-CM | POA: Diagnosis not present

## 2015-05-24 DIAGNOSIS — E611 Iron deficiency: Secondary | ICD-10-CM

## 2015-05-24 MED ORDER — ESCITALOPRAM OXALATE 10 MG PO TABS: 10.0000 mg | ORAL_TABLET | Freq: Every day | ORAL | Status: DC

## 2015-06-22 ENCOUNTER — Telehealth: Payer: Self-pay

## 2015-06-22 NOTE — Patient Instructions (Signed)
Take Lexapro 10 mg daily and follow-up in 6-8 weeks. Rest and drink plenty of fluids.

## 2015-06-22 NOTE — Progress Notes (Signed)
   Subjective:    Patient ID: Tara Harrison, female    DOB: 1970-12-25, 45 y.o.   MRN: [id number]  HPI Patient was here in January for the first time. Had seen Beather ArbourVicki Henson in October with complaint of dizziness and vertigo. Now having vertigo once again. Admits she is under some stress. Going through divorce. Having some financial issues. Doesn't really like her job and hasn't had a raise in some time. Has vitamin D deficiency and iron deficiency.    Review of Systems     Objective:   Physical Exam  Constitutional: She is oriented to person, place, and time. She appears well-developed and well-nourished.  HENT:  Right Ear: External ear normal.  Left Ear: External ear normal.  Mouth/Throat: No oropharyngeal exudate.  Eyes: Conjunctivae and EOM are normal. Pupils are equal, round, and reactive to light.  Neck: Neck supple. No thyromegaly present.  Cardiovascular: Normal rate and normal heart sounds.   No murmur heard. Pulmonary/Chest: No respiratory distress. She has no wheezes. She has no rales.  Musculoskeletal: She exhibits no edema.  Neurological: She is alert and oriented to person, place, and time. She has normal reflexes. No cranial nerve deficit. Coordination normal.  No nystagmus  Skin: Skin is warm and dry. She is not diaphoretic.   Blood pressure lying is 102/70 with pulse of 57 blood pressure sitting is 100/68 pulse of 63 standing blood pressure is 100/72 with pulse of 71. She is afebrile.       Assessment & Plan:  Dizziness  Iron deficiency  Vitamin D deficiency  Situational stress  Plan: I think the patient has situational stress. She's complained of dizziness on 3 occasions October, January and now. She is not orthostatic. No focal deficits on neurological exam. No nystagmus. I think it's unlikely that she has any intracranial pathology. I think she is depressed. She should continue iron and vitamin D supplementation. I want to start her on Lexapro 10 mg  daily and follow-up in 6-8 weeks. She is somewhat reluctant to try Lexapro but I think it would help. No labs drawn today.  Spent 30 minutes speaking with patient about stress and doing physical examination.

## 2015-06-22 NOTE — Telephone Encounter (Signed)
Patient contacted and states that she is taking this lexapro 1/2 tab every other day. She states that it is helping.

## 2015-07-21 IMAGING — CR DG TOE 5TH 2+V*R*
3 series · 3 of 3 positions shown · non-contrast
Comparison: None.

CLINICAL DATA: Right fifth toe injury.

EXAM:
RIGHT FIFTH TOE

[x toes ap right]
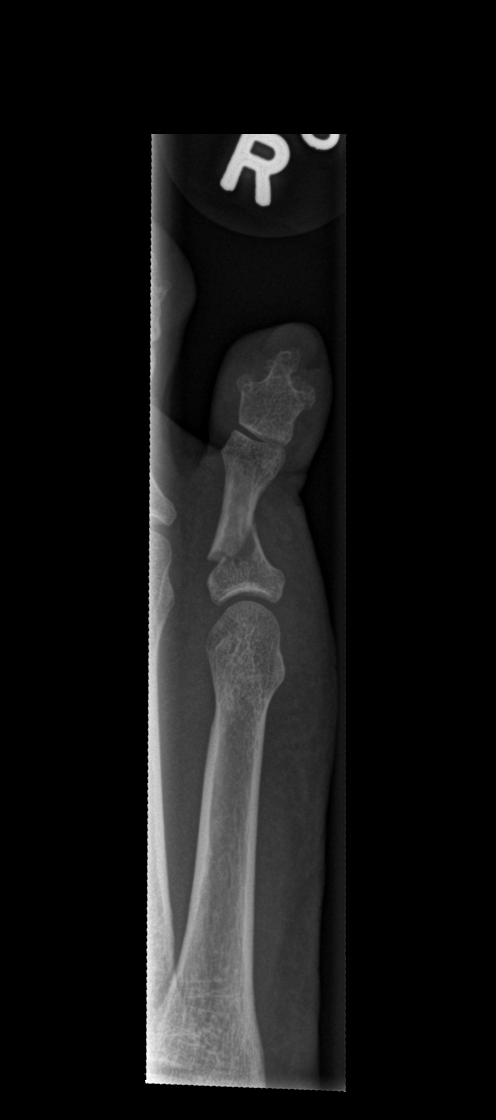

[x toes obl right]
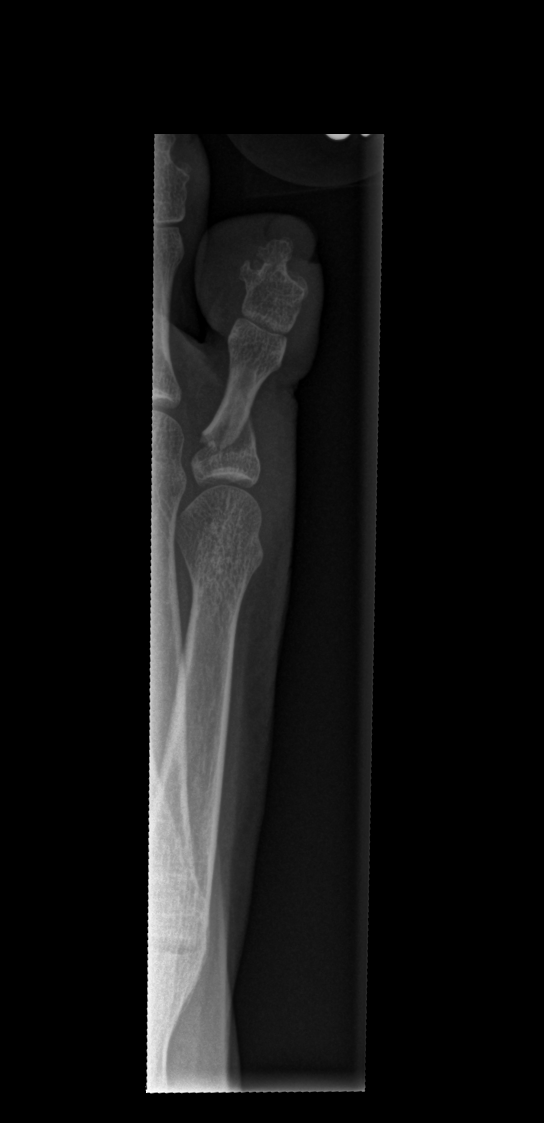

[x toes lat right]
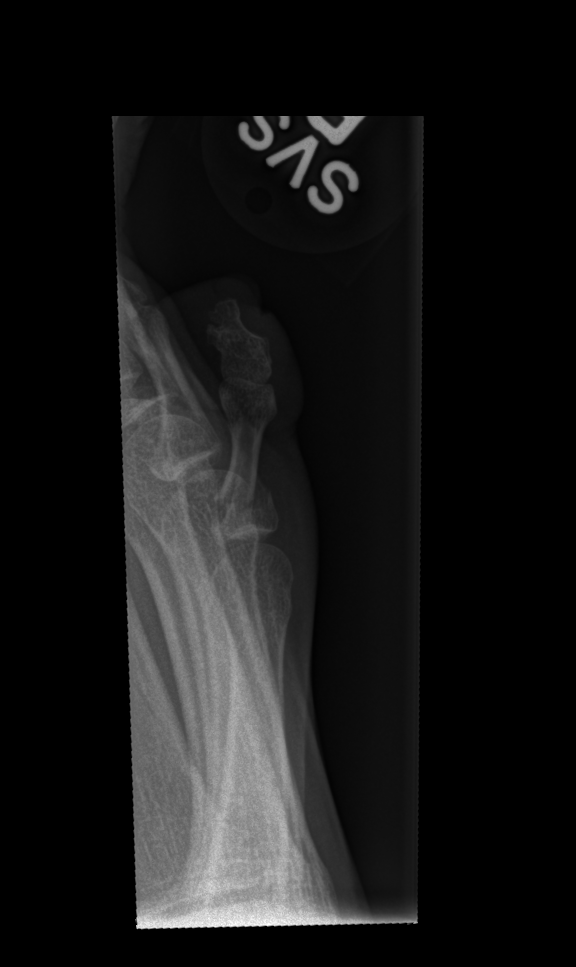

[3 of 3 positions shown; findings below may reference images not displayed]

FINDINGS: Mostly transverse fracture of the proximal aspect of the proximal
phalanx of the right fifth toe. There is dorsal and lateral
angulation of the distal fracture fragment. With partial volar
displacement.
IMPRESSION: Fracture of the proximal phalanx right fifth toe.

## 2017-06-01 ENCOUNTER — Encounter: Payer: Self-pay | Admitting: Internal Medicine

## 2017-06-01 ENCOUNTER — Ambulatory Visit: Payer: 59 | Admitting: Internal Medicine

## 2017-06-01 VITALS — BP 110/80 | HR 78 | Temp 98.0°F | Ht 66.0 in | Wt 156.0 lb

## 2017-06-01 DIAGNOSIS — J22 Unspecified acute lower respiratory infection: Secondary | ICD-10-CM

## 2017-06-01 MED ORDER — FLUCONAZOLE 150 MG PO TABS: 150.0000 mg | ORAL_TABLET | Freq: Once | ORAL | 0 refills | Status: AC

## 2017-06-01 MED ORDER — AZITHROMYCIN 250 MG PO TABS: ORAL_TABLET | ORAL | 0 refills | Status: DC

## 2017-06-01 MED ORDER — HYDROCODONE-HOMATROPINE 5-1.5 MG/5ML PO SYRP: 5.0000 mL | ORAL_SOLUTION | Freq: Three times a day (TID) | ORAL | 0 refills | Status: DC | PRN

## 2017-06-01 NOTE — Progress Notes (Signed)
   Subjective:    Patient ID: Tara Harrison, female    DOB: 04/06/1970, 47 y.o.   MRN: [id number]  HPI 47 year old Female in today with respiratory infection symptoms that started on Friday, March 8 with cough and scratchy throat.  Did not take flu vaccine.  Has not had fever or shaking chills or myalgias.  Voice has been hoarse and she has had some green sputum production.  Coughing a lot at night.  Still working at [employer].  Has not been sick this entire winter with any respiratory infections    Review of Systems see above     Objective:   Physical Exam She sounds hoarse when she speaks.  Pharynx is clear.  Right TM is full but not red.  Left TM not well seen due to cerumen.  Neck is supple without adenopathy.  Chest is clear to auscultation but she sounds nasally congested.       Assessment & Plan:  Acute lower respiratory infection  Plan: Zithromax Z-Pak take 2 tablets day 1 followed by 1 tablet days 2 through 5.  Hycodan 1 teaspoon p.o. every 8 hours as needed cough.  Diflucan to take if has Candida vaginitis on antibiotics.  Rest and drink plenty of fluids.  Note to be out of work today and tomorrow.

## 2017-06-01 NOTE — Progress Notes (Deleted)
   Subjective:    Patient ID: Tara Harrison, female    DOB: 09-28-1970, 47 y.o.   MRN: [id number]  HPI 47 year old Black female in with respiratory  infection symptoms. Cough and congestion. Sore throat, malaise and fatigue.  Some discolored sputum.    Review of Systems see above     Objective:   Physical Exam Skin warm and dry.  Nodes none.  Neck is supple.  No adenopathy.  Pharynx very slightly injected without exudate.  TMs clear.  Chest clear to auscultation without rales or wheezing.       Assessment & Plan:  Acute lower respiratory infection  Plan: Rest and drink plenty of fluids.  Zithromax Z-Pak take 2 tablets day 1  followed by 1 tablet days 2 through 5.  Hycodan 1 teaspoon p.o. every 8 hours as needed cough.  Take Diflucan if needed for Candida vaginitis.

## 2017-06-01 NOTE — Patient Instructions (Addendum)
Rest and drink plenty of fluids.  Take Zithromax Z-Pak as directed.  Take Hycodan as needed for cough.  Diflucan if needed for Candida vaginitis.

## 2019-09-15 ENCOUNTER — Other Ambulatory Visit: Payer: Self-pay

## 2019-09-15 ENCOUNTER — Telehealth: Payer: Self-pay | Admitting: Family Medicine

## 2019-09-15 ENCOUNTER — Encounter: Payer: Self-pay | Admitting: Internal Medicine

## 2019-09-15 ENCOUNTER — Ambulatory Visit (INDEPENDENT_AMBULATORY_CARE_PROVIDER_SITE_OTHER): Payer: Self-pay | Admitting: Internal Medicine

## 2019-09-15 VITALS — BP 100/70 | HR 60 | Ht 66.0 in | Wt 159.0 lb

## 2019-09-15 DIAGNOSIS — K120 Recurrent oral aphthae: Secondary | ICD-10-CM

## 2019-09-15 LAB — IRON: Iron: 16 ug/dL — ABNORMAL LOW (ref 40–190)

## 2019-09-15 LAB — HEMOGLOBIN: Hemoglobin: 9 g/dL — ABNORMAL LOW (ref 11.7–15.5)

## 2019-09-15 MED ORDER — FLUCONAZOLE 150 MG PO TABS: 150.0000 mg | ORAL_TABLET | Freq: Once | ORAL | 1 refills | Status: AC

## 2019-09-15 MED ORDER — DOXYCYCLINE HYCLATE 100 MG PO TABS: 100.0000 mg | ORAL_TABLET | Freq: Two times a day (BID) | ORAL | 0 refills | Status: DC

## 2019-09-15 NOTE — Telephone Encounter (Signed)
We cannot call anything in without seeing her. Not seen since 2019.

## 2019-09-15 NOTE — Telephone Encounter (Signed)
Appointment scheduled.

## 2019-09-15 NOTE — Progress Notes (Signed)
   Subjective:    Patient ID: Boston Service, female    DOB: 1970-04-22, 49 y.o.   MRN: [id number]  HPI Pleasant 49 year old Female moved away to [place] a couple of years ago but has returned to the [place] area.  Last seen here in March 2019.  Says son just graduated from high school and will be going to community college here in the area.  Patient denies excessive stress but has developed irritation tip of her tongue.  History of vitamin D deficiency in 2016.  Last Pap smear on file was done 2017.  Having some fatigue issues.  We have decided to check hemoglobin and iron level today as well.  Review of Systems see above     Objective:   Physical Exam She is afebrile.  Blood pressure 100/70 pulse 60 temperature 98 degrees orally weight 159 pounds height 5 feet 6 inches BMI 25.6 She has small ulceration right tongue just lateral to the midline.  It is tender.  No other ulcerations or lesions noted in mouth or on tongue.      Assessment & Plan:  Aphthous ulcer of tongue  Check hemoglobin and iron level today.  We limited lab work due to issues with no health insurance currently.  Plan: Doxycycline 100 mg twice daily for 7 days.  Diflucan to take if develop Candida vaginitis symptoms while on doxycycline.

## 2019-09-15 NOTE — Telephone Encounter (Signed)
Tara Harrison [phone]  [name] called to say that her tongue is itchy and has white bumps on it and is painful and she is unable to eat. She called it ([facility]) She has no insurance, in between jobs.

## 2019-09-15 NOTE — Patient Instructions (Addendum)
It was a pleasure to see you today.  Take doxycycline 100 mg twice daily for 7 days.  Take Diflucan if develops Candida vaginitis while on antibiotic therapy.  Iron level and hemoglobin checked today.

## 2019-09-16 ENCOUNTER — Other Ambulatory Visit: Payer: Self-pay

## 2019-09-16 MED ORDER — FUSION PLUS PO CAPS: ORAL_CAPSULE | ORAL | 2 refills | Status: DC

## 2020-01-31 ENCOUNTER — Telehealth: Payer: Self-pay | Admitting: Internal Medicine

## 2020-01-31 NOTE — Telephone Encounter (Signed)
She is past due for follow up on iron deficiency anemia. Last seen in June and was supposed to be back in September. I will call pharmacy and ask them to provide a substitute for her that is similar. I need to know if she has been out of this for a while or just a few days as that will affect her lab work.

## 2020-01-31 NOTE — Telephone Encounter (Addendum)
Scheduled appointments, and let patient know to get with her pharmacy about new Iron supplement since her old one is being discontinued. She verbalized understanding.

## 2020-01-31 NOTE — Telephone Encounter (Signed)
Tara Harrison [phone]  Clarisse Gouge called to say that the pharmacy has told her that the below medication is being discontinued and she would like to know what you would recommended in its place.  Iron-FA-B Cmp-C-Biot-Probiotic (FUSION PLUS) CAPS

## 2020-01-31 NOTE — Telephone Encounter (Signed)
Let's get back on iron supplement- have her check today with pharmacy- we sent them a fax about substitution for the iron supplement that is no longer available. See in 4-6 weeks  and will need CBC Fe/TIBC before OV

## 2020-01-31 NOTE — Telephone Encounter (Signed)
Tara Harrison called back to say she has not taking her Iron in about 2 weeks, she does have a few left. She did state she now has insurance and can schedule an appointment.

## 2020-01-31 NOTE — Telephone Encounter (Signed)
LVM to Tara Harrison, ask to see if she has missed any of her iron

## 2020-03-06 ENCOUNTER — Other Ambulatory Visit: Payer: Self-pay | Admitting: Internal Medicine

## 2020-03-08 ENCOUNTER — Encounter: Payer: Self-pay | Admitting: Internal Medicine

## 2020-03-08 ENCOUNTER — Other Ambulatory Visit: Payer: Self-pay

## 2020-03-08 ENCOUNTER — Ambulatory Visit: Payer: Managed Care, Other (non HMO) | Admitting: Internal Medicine

## 2020-03-08 VITALS — BP 108/68 | HR 64 | Ht 66.0 in | Wt 159.0 lb

## 2020-03-08 DIAGNOSIS — E611 Iron deficiency: Secondary | ICD-10-CM

## 2020-03-08 DIAGNOSIS — E559 Vitamin D deficiency, unspecified: Secondary | ICD-10-CM | POA: Diagnosis not present

## 2020-03-08 DIAGNOSIS — Z862 Personal history of diseases of the blood and blood-forming organs and certain disorders involving the immune mechanism: Secondary | ICD-10-CM | POA: Diagnosis not present

## 2020-03-08 MED ORDER — ERGOCALCIFEROL 1.25 MG (50000 UT) PO CAPS
50000.0000 [IU] | ORAL_CAPSULE | ORAL | 7 refills | Status: DC
Start: 2020-03-08 — End: 2022-02-25

## 2020-03-08 NOTE — Progress Notes (Addendum)
   Subjective:    Patient ID: Tara Harrison, female    DOB: 11-Aug-1970, 49 y.o.   MRN: [id number]  HPI Pleasant 49 year old Female seen for follow up on iron deficiency anemia. Brings in labs from [place] where she is employed showing she also has Vitamin D deficiency.  She has a history of vitamin D deficiency diagnosed in 2016.  In June of this year, we discovered she also had iron deficiency when she was complaining of fatigue.  Her hemoglobin at the time was 9 grams and 4 years previously had been 12.3 g.  Serum iron level was low at 16 and 5 years previously had been 37.  She was placed on iron supplementation and has done well.  Currently hemoglobin is 11.8 g with an MCV of 92. All of her lab work done through [facility] on December 13 was explained to her today in detail.  However, vitamin D level is low at this time at 13.6.  She will be placed on high-dose vitamin D supplementation.  This will be rechecked at time of physical exam in June 2022.    Review of Systems no new complaints.  Feels well.  Reviewed lab work that she brings in with her     Objective:   Physical Exam Blood pressure 108/68 pulse 64 regular pulse oximetry 98% weight 159 pounds BMI 25.66 Pleasant and cooperative.  She was not examined today.  We discussed her lab report from [facility] in detail  Says she feels well and does not feel tired.  Says she can tell a difference in her energy level and wellbeing on iron supplementation.    Assessment & Plan:  History of iron deficiency-this is resolved currently.  She will remain on Fusion plus  iron supplementation and we will follow-up in June 2022 at which time she will be seen for health maintenance exam.  She will have fasting labs at that time through [facility] where she is employed.  Written order was given to her today for those CPE labs including vitamin D level  Vitamin D deficiency-result reviewed from [facility].  She will be placed on high-dose vitamin D 50,000  units weekly until June 2022 at which time labs will be repeated and reviewed with her.  In October 2016 her vitamin D level was low at 15 but improved to 43 in December 2016 and 30 in January 2017.

## 2020-03-08 NOTE — Patient Instructions (Signed)
Take 50,000 units vitamin D3 weekly until health maintenance exam in June 2022.  Prescription given to have fasting labs drawn prior to that appointment through [facility] where patient is employed.  Continue with fusion plus for iron deficiency anemia.  Currently she is not iron deficient nor anemic.

## 2020-03-09 NOTE — Addendum Note (Signed)
Addended by: Judd Gaudier on: 03/09/2020 09:26 AM   Modules accepted: Orders

## 2020-04-22 ENCOUNTER — Other Ambulatory Visit: Payer: Self-pay | Admitting: Internal Medicine

## 2020-09-06 ENCOUNTER — Encounter: Payer: Managed Care, Other (non HMO) | Admitting: Internal Medicine

## 2020-12-11 ENCOUNTER — Encounter: Payer: Self-pay | Admitting: Internal Medicine

## 2021-03-04 ENCOUNTER — Other Ambulatory Visit: Payer: Self-pay | Admitting: Internal Medicine

## 2021-03-04 DIAGNOSIS — E559 Vitamin D deficiency, unspecified: Secondary | ICD-10-CM

## 2021-03-04 DIAGNOSIS — E611 Iron deficiency: Secondary | ICD-10-CM

## 2021-03-04 DIAGNOSIS — Z862 Personal history of diseases of the blood and blood-forming organs and certain disorders involving the immune mechanism: Secondary | ICD-10-CM

## 2021-07-04 DIAGNOSIS — Z1231 Encounter for screening mammogram for malignant neoplasm of breast: Secondary | ICD-10-CM | POA: Diagnosis not present

## 2021-10-10 ENCOUNTER — Other Ambulatory Visit: Payer: Self-pay | Admitting: Internal Medicine

## 2022-02-24 ENCOUNTER — Telehealth: Payer: Self-pay | Admitting: Internal Medicine

## 2022-02-24 NOTE — Telephone Encounter (Signed)
Tara Harrison [phone]  Tara Harrison called to say she has had a cough for several weeks, it started out with scratchy throat and runny nose. Now she mainly.just has a cough and some drainage at night. Would like to come in tomorrow

## 2022-02-24 NOTE — Telephone Encounter (Signed)
Schedule for tomorrow and ask to wear mask

## 2022-02-25 ENCOUNTER — Ambulatory Visit: Payer: BC Managed Care – PPO | Admitting: Internal Medicine

## 2022-02-25 ENCOUNTER — Encounter: Payer: Self-pay | Admitting: Internal Medicine

## 2022-02-25 VITALS — BP 130/70 | HR 69 | Temp 98.6°F

## 2022-02-25 DIAGNOSIS — J069 Acute upper respiratory infection, unspecified: Secondary | ICD-10-CM

## 2022-02-25 MED ORDER — FLUCONAZOLE 150 MG PO TABS: ORAL_TABLET | ORAL | 0 refills | Status: AC

## 2022-02-25 MED ORDER — AZITHROMYCIN 250 MG PO TABS: ORAL_TABLET | ORAL | 0 refills | Status: AC

## 2022-02-25 MED ORDER — BENZONATATE 100 MG PO CAPS: 100.0000 mg | ORAL_CAPSULE | Freq: Three times a day (TID) | ORAL | 0 refills | Status: AC | PRN

## 2022-02-25 NOTE — Progress Notes (Signed)
   Subjective:    Patient ID: Tara Harrison, female    DOB: 1970-09-25, 51 y.o.   MRN: [id number]  HPI Pleasant 51 year old Female patient not seen here since 2019.  She has no hx of serious illness, accidents or operations. Seen today for a cough ongoing for several weeks.  It began with a runny nose and scratchy throat. Now with cough and postnasal drip that will not go away.  NKDA.  SHx: Patient now living in [place]. Has a one year old granddaughter. Working for [employer] in [place].    Review of Systems see above no fever, chills, myalgias, nausea or vomiting     Objective:   Physical Exam Temperature 98.6 degrees pulse 69 blood pressure 130/70 pulse oximetry 99% Skin: Warm and dry.  Nodes none.  TMs are clear.  Neck is supple.  Chest is clear to auscultation without rales or wheezing.      Assessment & Plan:   Protracted upper respiratory infection  Plan: Zithromax Z-PAK 2 tabs day 1 followed by 1 tab days 2 through 5.  Tessalon Perles 100 mg to take up to 3 times daily as needed for cough.  Diflucan 100 mg tablet to take if develops Candida vaginitis while on antibiotics.  Rest and stay well-hydrated.  Call if not better in 7 to 10 days or sooner if worse.

## 2022-03-23 NOTE — Patient Instructions (Signed)
It was a pleasure to see you again today.  Take Zithromax Z-PAK as directed.  Tessalon Perles up to 3 times daily as needed for cough.  May take Diflucan 1 tablet if develops Candida vaginitis symptoms.  Call if not better in 7 to 10 days with respiratory infection/cough or sooner if worse.

## 2022-07-15 DIAGNOSIS — R92323 Mammographic fibroglandular density, bilateral breasts: Secondary | ICD-10-CM | POA: Diagnosis not present

## 2022-07-15 DIAGNOSIS — Z1231 Encounter for screening mammogram for malignant neoplasm of breast: Secondary | ICD-10-CM | POA: Diagnosis not present

## 2022-07-15 LAB — HM MAMMOGRAPHY

## 2022-07-17 ENCOUNTER — Encounter: Payer: Self-pay | Admitting: Internal Medicine

## 2022-12-22 ENCOUNTER — Other Ambulatory Visit: Payer: Self-pay | Admitting: Internal Medicine

## 2022-12-24 NOTE — Telephone Encounter (Signed)
Called patient, she said she has moved to [place] she has been there for 2 months and she is wanting to know if you could refill this until she finds a provider there.

## 2023-02-05 ENCOUNTER — Telehealth: Payer: Self-pay

## 2023-02-05 NOTE — Telephone Encounter (Signed)
Copied from CRM [id number]. Topic: Clinical - Medical Advice >> Feb 05, 2023  2:17 PM Herbert Seta B wrote: Reason for CRM: Patient calling because she needs to speak to PCP in regards to low iron issues/medication.[phone]   Patient hasn't been seen in almost a year. She needs an office visit to discuss concerns with Dr. Baird Kay. Thanks.

## 2023-02-05 NOTE — Telephone Encounter (Addendum)
Called pt to advise her an appt was needed to facilitate this. Per pt, she has relocated to [place] and will only be in town certain days. Talked through options with her and was not able to get her scheduled. Advised pt to send a MyChart message to PCP for possible options to manage until an appt can be made.
